# Patient Record
Sex: Female | Born: 1970 | Race: White | Hispanic: No | Smoking: Never smoker
Health system: Southern US, Community
[De-identification: ages and names within clinical notes are randomized; demographics above are authoritative.]

---

## 2015-09-13 ENCOUNTER — Emergency Department (INDEPENDENT_AMBULATORY_CARE_PROVIDER_SITE_OTHER): Payer: BLUE CROSS/BLUE SHIELD

## 2015-09-13 ENCOUNTER — Emergency Department
Admission: EM | Admit: 2015-09-13 | Discharge: 2015-09-13 | Disposition: A | Discharge status: Home / Self Care | Payer: BLUE CROSS/BLUE SHIELD | Source: Home / Self Care

## 2015-09-13 DIAGNOSIS — J3489 Other specified disorders of nose and nasal sinuses: Secondary | ICD-10-CM | POA: Diagnosis not present

## 2015-09-13 DIAGNOSIS — J069 Acute upper respiratory infection, unspecified: Secondary | ICD-10-CM | POA: Diagnosis not present

## 2015-09-13 DIAGNOSIS — R509 Fever, unspecified: Secondary | ICD-10-CM

## 2015-09-13 LAB — POCT CBC W AUTO DIFF (K'VILLE URGENT CARE)

## 2015-09-13 LAB — POCT INFLUENZA A/B
INFLUENZA A, POC: NEGATIVE
INFLUENZA B, POC: NEGATIVE

## 2015-09-13 MED ORDER — PREDNISONE 20 MG PO TABS: 20.0000 mg | ORAL_TABLET | Freq: Two times a day (BID) | ORAL | Status: AC

## 2015-09-13 MED ORDER — FLUTICASONE PROPIONATE 50 MCG/ACT NA SUSP: NASAL | Status: AC

## 2015-09-13 NOTE — ED Notes (Signed)
Patient states that she called [facility] approx a week ago and was given prednisone for a sinus issue, states she was no better then the [facility] gave her Amox 875 opne po BID on 09/10/2015 she states that she still does not feel well. She states that she now has been running a fever, body aches ( mostly in the morning) , says that her sinus issue is somewhat better

## 2015-09-13 NOTE — ED Provider Notes (Signed)
CSN: [id number]     Arrival date & time 09/13/15  1323 History   None    Chief Complaint  Patient presents with  . Facial Pain      HPI Comments: Patient reports that she developed sinus congestion about two weeks ago but did not feel ill.  About 11 days ago she called [facility] and was prescribed prednisone.  She felt somewhat better but her symptoms persisted and she was then prescribed amoxicillin  BID three days ago.  During the past three days she has developed myalgias, fatigue, chills/sweats and low grade fever to 99.6.  Her sinus congestion has increased and her ears feel clogged.  The history is provided by the patient.    History reviewed. No pertinent past medical history. History reviewed. No pertinent past surgical history. History reviewed. No pertinent family history. Social History  Substance Use Topics  . Smoking status: Never Smoker   . Smokeless tobacco: None  . Alcohol Use: None   OB History    No data available     Review of Systems No sore throat No cough + sneezing No pleuritic pain No wheezing + nasal congestion + post-nasal drainage + sinus pain/pressure No itchy/red eyes No earache No hemoptysis No SOB + fever, + chills/sweats No nausea No vomiting No abdominal pain No diarrhea No urinary symptoms No skin rash + fatigue + myalgias + headache Used OTC meds without relief  Allergies  Review of patient's allergies indicates no known allergies.  Home Medications   Prior to Admission medications   Medication Sig Start Date End Date Taking? Authorizing Provider  Norgestimate-Ethinyl Estradiol Triphasic (TRI-LO-SPRINTEC) 0.18/0.215/0.25 MG-25 MCG tab Take 1 tablet by mouth daily.   Yes Historical Provider, MD  fluticasone Aleda Grana) 50 MCG/ACT nasal spray Place two sprays in each nostril once daily 09/13/15   Lattie Haw, MD  predniSONE (DELTASONE) 20 MG tablet Take 1 tablet (20 mg total) by mouth 2 (two) times daily. Take with food.  09/13/15   Lattie Haw, MD   Meds Ordered and Administered this Visit  Medications - No data to display  BP 126/87 mmHg  Pulse 78  Temp(Src) 98.4 F (36.9 C)  Ht  (1.727 m)  Wt 295 lb (133.811 kg)  BMI 44.86 kg/m2  SpO2 100%  LMP 09/02/2015 (Exact Date) No data found.   Physical Exam Nursing notes and Vital Signs reviewed. Appearance:  Patient appears stated age, and in no acute distress.  Patient is obese (BMI 44.9) Eyes:  Pupils are equal, round, and reactive to light and accomodation.  Extraocular movement is intact.  Conjunctivae are not inflamed  Ears:  Canals normal.  Tympanic membranes normal.  Nose:  Congested turbinates.  No sinus tenderness.   Pharynx:  Normal Neck:  Supple.  Tender enlarged posterior nodes are palpated bilaterally  Lungs:  Clear to auscultation.  Breath sounds are equal.  Moving air well. Heart:  Regular rate and rhythm without murmurs, rubs, or gallops.  Abdomen:  Nontender without masses or hepatosplenomegaly.  Bowel sounds are present.  No CVA or flank tenderness.  Extremities:  No edema.  No calf tenderness Skin:  No rash present.   ED Course  Procedures  None     Labs Reviewed  POCT INFLUENZA A/B - Normal/negative  POCT CBC W AUTO DIFF ([facility]):  WBC 9.8; LY 18.2; MO 7.7; GR 74.1; Hgb 12.1; Platelets 322     Imaging Review Dg Sinuses Complete  09/13/2015  CLINICAL  DATA:  44 year old female with sinus pain for 2 weeks and fever. EXAM: PARANASAL SINUSES - COMPLETE 3 + VIEW COMPARISON:  None. FINDINGS: The paranasal sinus are aerated. There is no evidence of sinus opacification air-fluid levels or mucosal thickening. No significant bone abnormalities are seen. IMPRESSION: Negative. Electronically Signed   By: Harmon PierJeffrey  Hu M.D.   On: 09/13/2015 14:49      MDM   1. Viral URI; early onset    Reassurance There is no evidence of bacterial infection today.   Begin prednisone burst.  Rx for Flonase spray. Take plain  guaifenesin (1200mg  extended release tabs such as Mucinex) twice daily, with plenty of water, for cough and congestion.  May add Pseudoephedrine (30mg , one or two every 4 to 6 hours) for sinus congestion.  Get adequate rest.   May use Afrin nasal spray (or generic oxymetazoline) twice daily for about 5 days and then discontinue.  Also recommend using saline nasal spray several times daily and saline nasal irrigation (AYR is a common brand).  Use Flonase nasal spray each morning after using Afrin nasal spray and saline nasal irrigation. Try warm salt water gargles for sore throat.  May take Delsym Cough Suppressant at bedtime for nighttime cough.  Stop all antihistamines for now, and other non-prescription cough/cold preparations. Resume Amoxicillin if not improving about one week or if persistent fever develops (Given a prescription to hold, with an expiration date)  Follow-up with family doctor if not improving about10 days.    Lattie HawStephen A Beese, MD 09/13/15 2045

## 2015-09-13 NOTE — Discharge Instructions (Signed)
Take plain guaifenesin (1200mg  extended release tabs such as Mucinex) twice daily, with plenty of water, for cough and congestion.  May add Pseudoephedrine (30mg , one or two every 4 to 6 hours) for sinus congestion.  Get adequate rest.   May use Afrin nasal spray (or generic oxymetazoline) twice daily for about 5 days and then discontinue.  Also recommend using saline nasal spray several times daily and saline nasal irrigation (AYR is a common brand).  Use Flonase nasal spray each morning after using Afrin nasal spray and saline nasal irrigation. Try warm salt water gargles for sore throat.  May take Delsym Cough Suppressant at bedtime for nighttime cough.  Stop all antihistamines for now, and other non-prescription cough/cold preparations. Resume Amoxicillin if not improving about one week or if persistent fever develops   Follow-up with family doctor if not improving about10 days.

## 2015-09-16 ENCOUNTER — Telehealth: Payer: Self-pay | Admitting: Emergency Medicine

## 2016-11-04 IMAGING — CR DG SINUSES COMPLETE 3+V
3 series · 3 of 3 positions shown · non-contrast
Comparison: None.

CLINICAL DATA: 43-year-old female with sinus pain for 2 weeks and
fever.

EXAM:
PARANASAL SINUSES - COMPLETE 3 + VIEW

[[person_name] (1 of 3)]
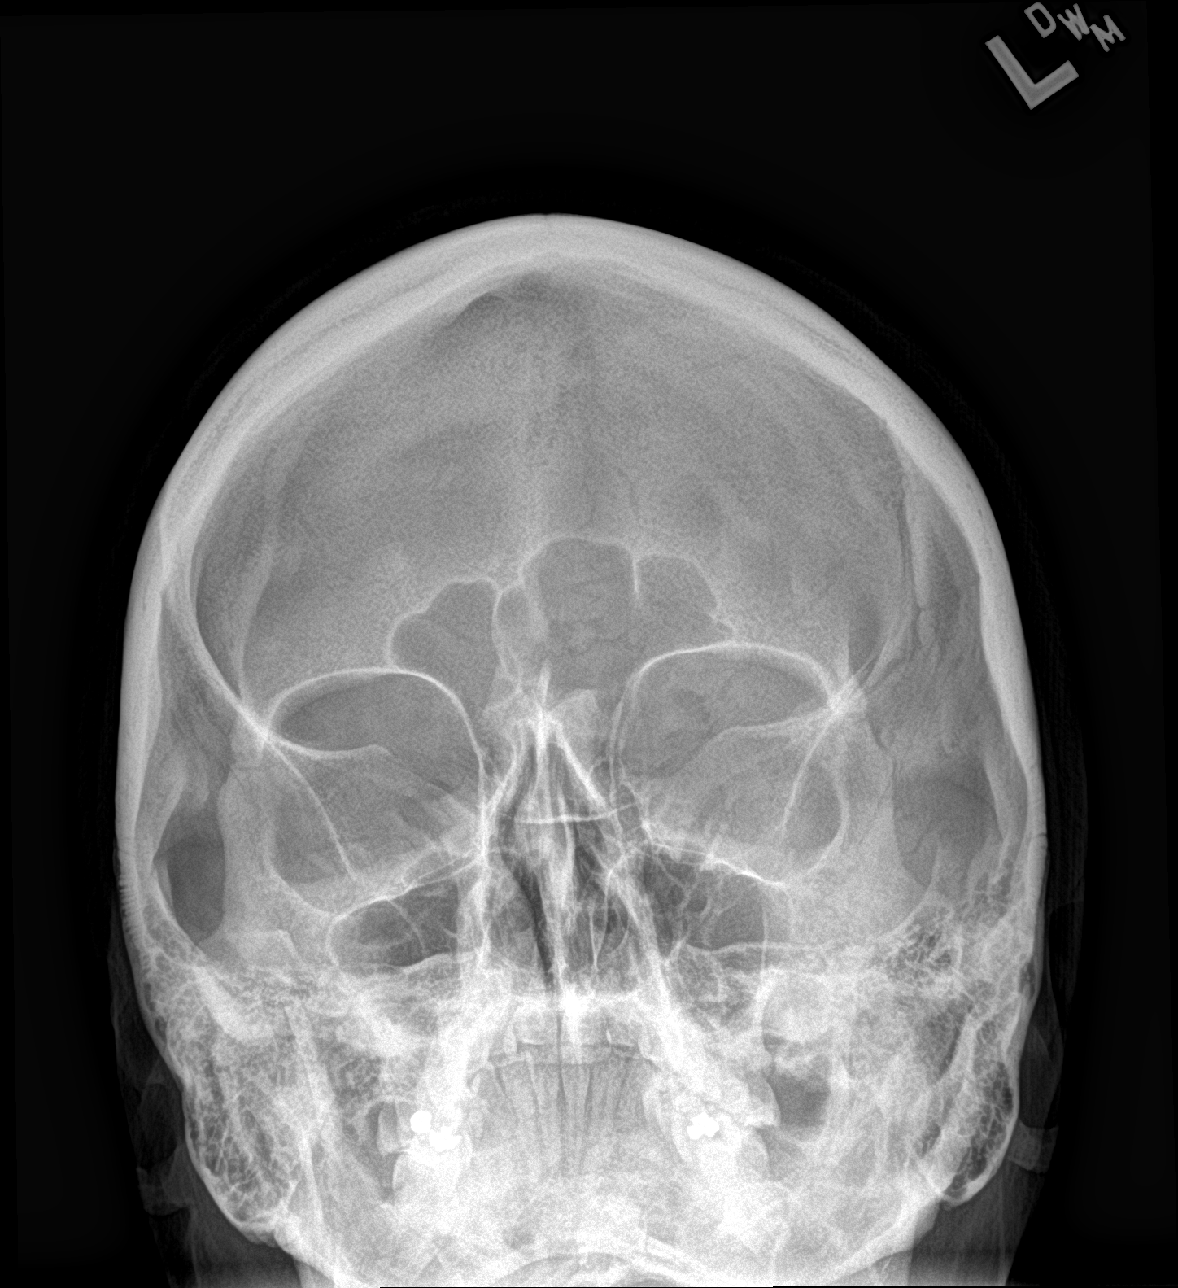

[[person_name] (2 of 3)]
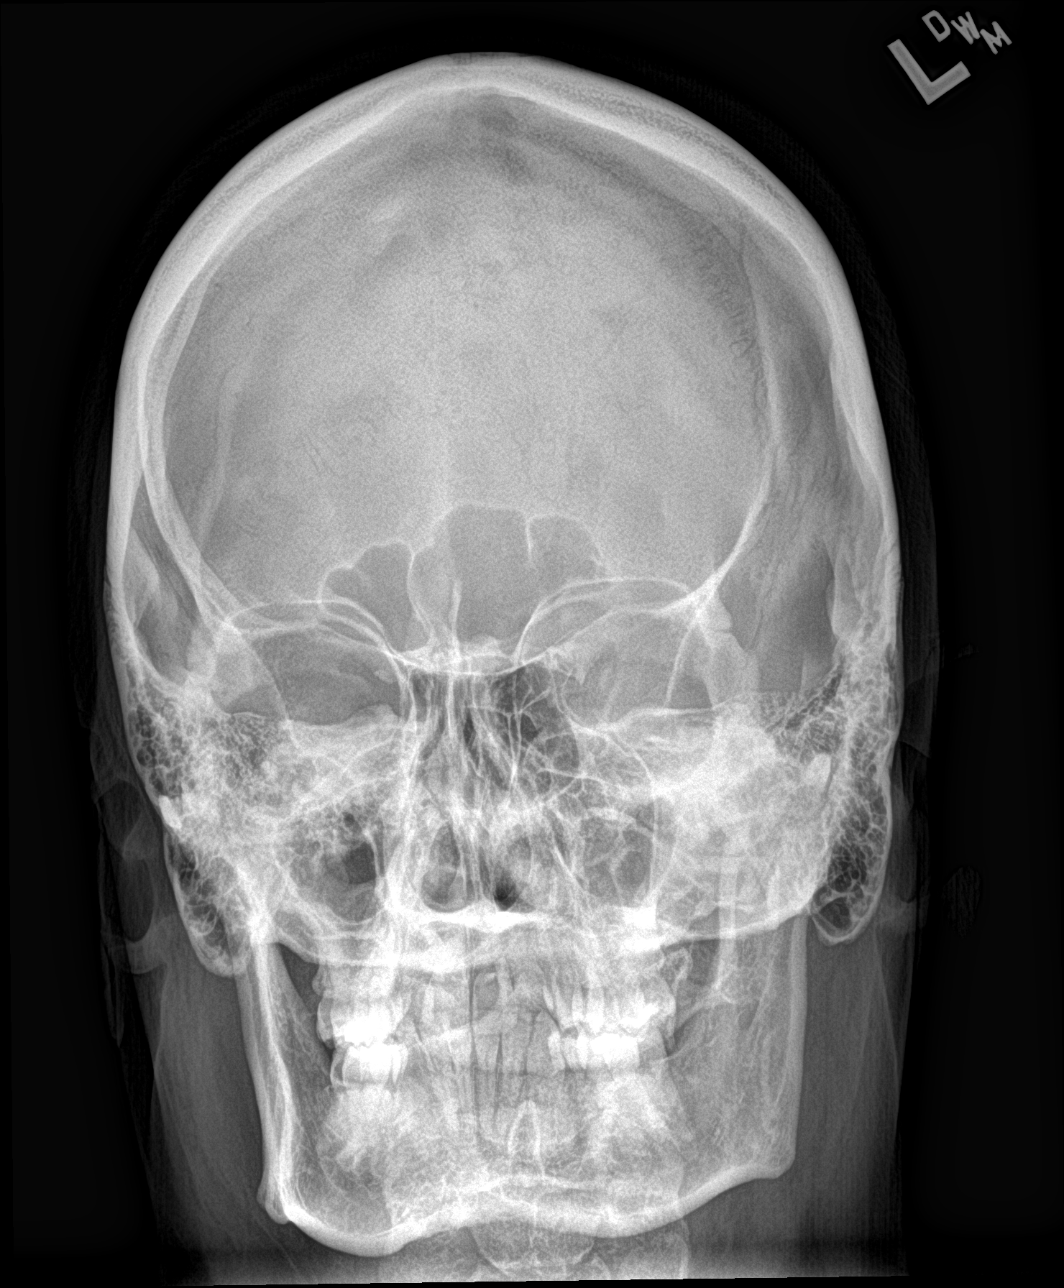

[[person_name] (3 of 3)]
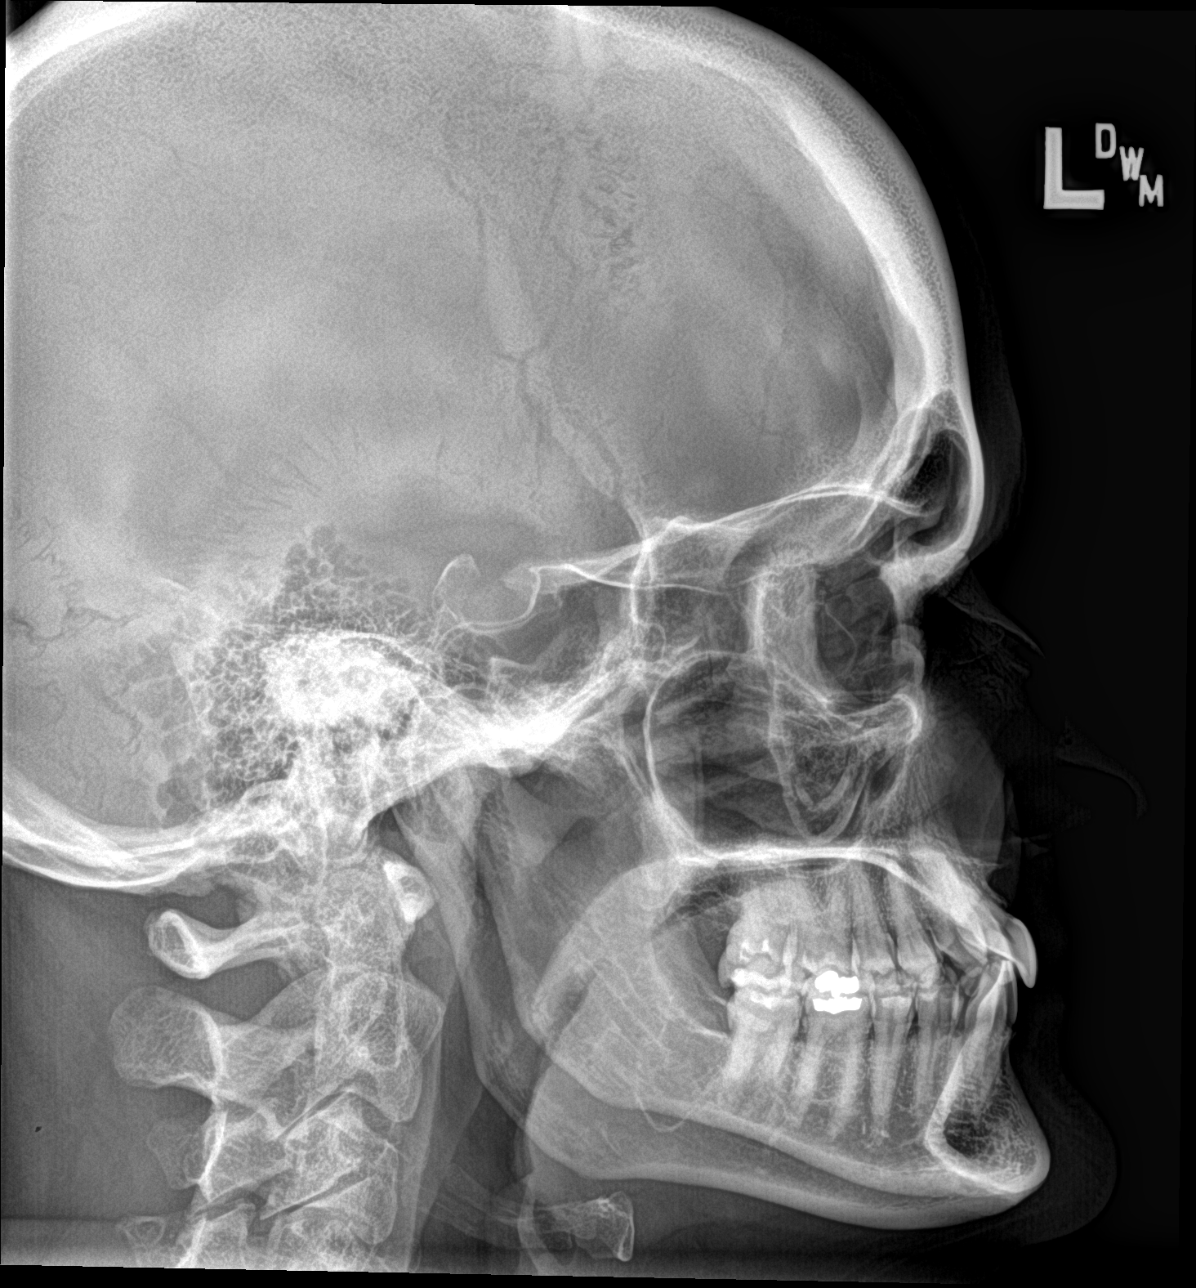

[3 of 3 positions shown; findings below may reference images not displayed]

FINDINGS: The paranasal sinus are aerated. There is no evidence of sinus
opacification air-fluid levels or mucosal thickening. No significant
bone abnormalities are seen.
IMPRESSION: Negative.
